# Patient Record
Sex: Female | Born: 1940 | Race: White | Hispanic: No | Marital: Married
Health system: Southern US, Community
[De-identification: ages and names within clinical notes are randomized; demographics above are authoritative.]

---

## 2014-12-12 ENCOUNTER — Emergency Department: Payer: Medicare Other

## 2014-12-12 ENCOUNTER — Emergency Department
Admission: EM | Admit: 2014-12-12 | Discharge: 2014-12-12 | Disposition: A | Discharge status: Home / Self Care | Payer: Medicare Other | Attending: Emergency Medicine | Admitting: Emergency Medicine

## 2014-12-12 DIAGNOSIS — J209 Acute bronchitis, unspecified: Secondary | ICD-10-CM | POA: Diagnosis not present

## 2014-12-12 DIAGNOSIS — J40 Bronchitis, not specified as acute or chronic: Secondary | ICD-10-CM

## 2014-12-12 DIAGNOSIS — J029 Acute pharyngitis, unspecified: Secondary | ICD-10-CM | POA: Diagnosis present

## 2014-12-12 LAB — POCT RAPID STREP A: Streptococcus, Group A Screen (Direct): NEGATIVE

## 2014-12-12 MED ORDER — LEVOFLOXACIN 500 MG PO TABS
500.0000 mg | ORAL_TABLET | Freq: Once | ORAL | Status: AC
  Administered 2014-12-12: 500 mg via ORAL
  Filled 2014-12-12: qty 1

## 2014-12-12 MED ORDER — LEVOFLOXACIN 500 MG PO TABS: 500.0000 mg | ORAL_TABLET | Freq: Every day | ORAL | Status: AC

## 2014-12-12 NOTE — ED Notes (Signed)
Pt to ED c/o persistent sore throat and cough for approximately 1 week. Currently on PO course of Cefidinir.

## 2014-12-12 NOTE — ED Notes (Signed)

## 2014-12-12 NOTE — Discharge Instructions (Signed)
Pharyngitis °Pharyngitis is redness, pain, and swelling (inflammation) of your pharynx.  °CAUSES  °Pharyngitis is usually caused by infection. Most of the time, these infections are from viruses (viral) and are part of a cold. However, sometimes pharyngitis is caused by bacteria (bacterial). Pharyngitis can also be caused by allergies. Viral pharyngitis may be spread from person to person by coughing, sneezing, and personal items or utensils (cups, forks, spoons, toothbrushes). Bacterial pharyngitis may be spread from person to person by more intimate contact, such as kissing.  °SIGNS AND SYMPTOMS  °Symptoms of pharyngitis include:   °· Sore throat.   °· Tiredness (fatigue).   °· Low-grade fever.   °· Headache. °· Joint pain and muscle aches. °· Skin rashes. °· Swollen lymph nodes. °· Plaque-like film on throat or tonsils (often seen with bacterial pharyngitis). °DIAGNOSIS  °Your health care provider will ask you questions about your illness and your symptoms. Your medical history, along with a physical exam, is often all that is needed to diagnose pharyngitis. Sometimes, a rapid strep test is done. Other lab tests may also be done, depending on the suspected cause.  °TREATMENT  °Viral pharyngitis will usually get better in 3-4 days without the use of medicine. Bacterial pharyngitis is treated with medicines that kill germs (antibiotics).  °HOME CARE INSTRUCTIONS  °· Drink enough water and fluids to keep your urine clear or pale yellow.   °· Only take over-the-counter or prescription medicines as directed by your health care provider:   °¨ If you are prescribed antibiotics, make sure you finish them even if you start to feel better.   °¨ Do not take aspirin.   °· Get lots of rest.   °· Gargle with 8 oz of salt water (½ tsp of salt per 1 qt of water) as often as every 1-2 hours to soothe your throat.   °· Throat lozenges (if you are not at risk for choking) or sprays may be used to soothe your throat. °SEEK MEDICAL  CARE IF:  °· You have large, tender lumps in your neck. °· You have a rash. °· You cough up green, yellow-[place]inskey, or bloody spit. °SEEK IMMEDIATE MEDICAL CARE IF:  °· Your neck becomes stiff. °· You drool or are unable to swallow liquids. °· You vomit or are unable to keep medicines or liquids down. °· You have severe pain that does not go away with the use of recommended medicines. °· You have trouble breathing (not caused by a stuffy nose). °MAKE SURE YOU:  °· Understand these instructions. °· Will watch your condition. °· Will get help right away if you are not doing well or get worse. °Document Released: 04/25/2005 Document Revised: 02/13/2013 Document Reviewed: 12/31/2012 °[employer]® Patient Information ©2015 [employer]. This information is not intended to replace advice given to you by your health care provider. Make sure you discuss any questions you have with your health care provider. °Acute Bronchitis °Bronchitis is inflammation of the airways that extend from the windpipe into the lungs (bronchi). The inflammation often causes mucus to develop. This leads to a cough, which is the most common symptom of bronchitis.  °In acute bronchitis, the condition usually develops suddenly and goes away over time, usually in a couple weeks. Smoking, allergies, and asthma can make bronchitis worse. Repeated episodes of bronchitis may cause further lung problems.  °CAUSES °Acute bronchitis is most often caused by the same virus that causes a cold. The virus can spread from person to person (contagious) through coughing, sneezing, and touching contaminated objects. °SIGNS AND SYMPTOMS  °·   Cough.   °· Fever.   °· Coughing up mucus.   °· Body aches.   °· Chest congestion.   °· Chills.   °· Shortness of breath.   °· Sore throat.   °DIAGNOSIS  °Acute bronchitis is usually diagnosed through a physical exam. Your health care provider will also ask you questions about your medical history. Tests, such as chest X-rays, are  sometimes done to rule out other conditions.  °TREATMENT  °Acute bronchitis usually goes away in a couple weeks. Oftentimes, no medical treatment is necessary. Medicines are sometimes given for relief of fever or cough. Antibiotic medicines are usually not needed but may be prescribed in certain situations. In some cases, an inhaler may be recommended to help reduce shortness of breath and control the cough. A cool mist vaporizer may also be used to help thin bronchial secretions and make it easier to clear the chest.  °HOME CARE INSTRUCTIONS °· Get plenty of rest.   °· Drink enough fluids to keep your urine clear or pale yellow (unless you have a medical condition that requires fluid restriction). Increasing fluids may help thin your respiratory secretions (sputum) and reduce chest congestion, and it will prevent dehydration.   °· Take medicines only as directed by your health care provider. °· If you were prescribed an antibiotic medicine, finish it all even if you start to feel better. °· Avoid smoking and secondhand smoke. Exposure to cigarette smoke or irritating chemicals will make bronchitis worse. If you are a smoker, consider using nicotine gum or skin patches to help control withdrawal symptoms. Quitting smoking will help your lungs heal faster.   °· Reduce the chances of another bout of acute bronchitis by washing your hands frequently, avoiding people with cold symptoms, and trying not to touch your hands to your mouth, nose, or eyes.   °· Keep all follow-up visits as directed by your health care provider.   °SEEK MEDICAL CARE IF: °Your symptoms do not improve after 1 week of treatment.  °SEEK IMMEDIATE MEDICAL CARE IF: °· You develop an increased fever or chills.   °· You have chest pain.   °· You have severe shortness of breath. °· You have bloody sputum.   °· You develop dehydration. °· You faint or repeatedly feel like you are going to pass out. °· You develop repeated vomiting. °· You develop a  severe headache. °MAKE SURE YOU:  °· Understand these instructions. °· Will watch your condition. °· Will get help right away if you are not doing well or get worse. °Document Released: 06/02/2004 Document Revised: 09/09/2013 Document Reviewed: 10/16/2012 °[employer]® Patient Information ©2015 [employer]. This information is not intended to replace advice given to you by your health care provider. Make sure you discuss any questions you have with your health care provider. ° °

## 2014-12-14 LAB — CULTURE, GROUP A STREP (THRC)

## 2014-12-18 NOTE — ED Provider Notes (Signed)
[facility] Emergency Department Provider Note  ____________________________________________  Time seen: 3:00 AM I have reviewed the triage vital signs and the nursing notes.   HISTORY  Chief Complaint Sore Throat     HPI Heather Cooley is a 74 y.o. female presents with persistent sore throat and cough approximately one week. Patient denies any fever of note patient was seen in prescribed Cefdinir by unknown M.D. She stated that she's completed this course however symptoms have persisted. She denies any fever     No past medical history on file.  There are no active problems to display for this patient.   No past surgical history on file.  Current Outpatient Rx  Name  Route  Sig  Dispense  Refill  . levofloxacin (LEVAQUIN) 500 MG tablet   Oral   Take 1 tablet (500 mg total) by mouth daily.   7 tablet   0     Allergies Review of patient's allergies indicates not on file.  No family history on file.  Social History Social History  Substance Use Topics  . Smoking status: Not on file  . Smokeless tobacco: Not on file  . Alcohol Use: Not on file    Review of Systems  Constitutional: Negative for fever. Eyes: Negative for visual changes. ENT: Positive for sore throat. Cardiovascular: Negative for chest pain. Respiratory: Negative for shortness of breath. Positive for cough Gastrointestinal: Negative for abdominal pain, vomiting and diarrhea. Genitourinary: Negative for dysuria. Musculoskeletal: Negative for back pain. Skin: Negative for rash. Neurological: Negative for headaches, focal weakness or numbness.   10-point ROS otherwise negative.  ____________________________________________   PHYSICAL EXAM:  VITAL SIGNS: ED Triage Vitals  Enc Vitals Group     BP 12/12/14 0302 143/79 mmHg     Pulse Rate 12/12/14 0302 56     Resp 12/12/14 0302 18     Temp 12/12/14 0302 98.2 F (36.8 C)     Temp Source 12/12/14 0302 Oral      SpO2 12/12/14 0302 99 %     Weight 12/12/14 0302 184 lb (83.462 kg)     Height 12/12/14 0302 5\' 2"  (1.575 m)     Head Cir --      Peak Flow --      Pain Score 12/12/14 0304 6     Pain Loc --      Pain Edu? --      Excl. in GC? --      Constitutional: Alert and oriented. Well appearing and in no distress. Eyes: Conjunctivae are normal. PERRL. Normal extraocular movements. ENT   Head: Normocephalic and atraumatic.   Nose: No congestion/rhinnorhea.   Mouth/Throat: Mucous membranes are moist.   Neck: No stridor. Cardiovascular: Normal rate, regular rhythm. Normal and symmetric distal pulses are present in all extremities. No murmurs, rubs, or gallops. Respiratory: Normal respiratory effort without tachypnea nor retractions. Breath sounds are clear and equal bilaterally. No wheezes/rales/rhonchi. Gastrointestinal: Soft and nontender. No distention. There is no CVA tenderness. Genitourinary: deferred Musculoskeletal: Nontender with normal range of motion in all extremities. No joint effusions.  No lower extremity tenderness nor edema. Neurologic:  Normal speech and language. No gross focal neurologic deficits are appreciated. Speech is normal.  Skin:  Skin is warm, dry and intact. No rash noted. Psychiatric: Mood and affect are normal. Speech and behavior are normal. Patient exhibits appropriate insight and judgment.      DG Chest 2 View (Final result) Result time: 12/12/14 03:58:46   Final  result by Rad Results In Interface (12/12/14 03:58:46)   Narrative:   CLINICAL DATA: Persistent sore throat and cough for approximately 1 week.  EXAM: CHEST 2 VIEW  COMPARISON: None.  FINDINGS: The heart size and mediastinal contours are within normal limits. Both lungs are clear. The visualized skeletal structures are unremarkable.  IMPRESSION: No active cardiopulmonary disease.   Electronically Signed By: Ellery Plunk M.D. On: 12/12/2014 03:58             INITIAL IMPRESSION / ASSESSMENT AND PLAN / ED COURSE  Pertinent labs & imaging results that were available during my care of the patient were reviewed by me and considered in my medical decision making (see chart for details).    ____________________________________________   FINAL CLINICAL IMPRESSION(S) / ED DIAGNOSES  Final diagnoses:  Bronchitis  Pharyngitis  Acute bronchitis, unspecified organism      Darci Current, MD 12/18/14 [id number]

## 2016-09-18 IMAGING — CR DG CHEST 2V
2 series · 2 of 2 positions shown · non-contrast
Comparison: None.

CLINICAL DATA: Persistent sore throat and cough for approximately 1
week.

EXAM:
CHEST  2 VIEW

[chest pa]
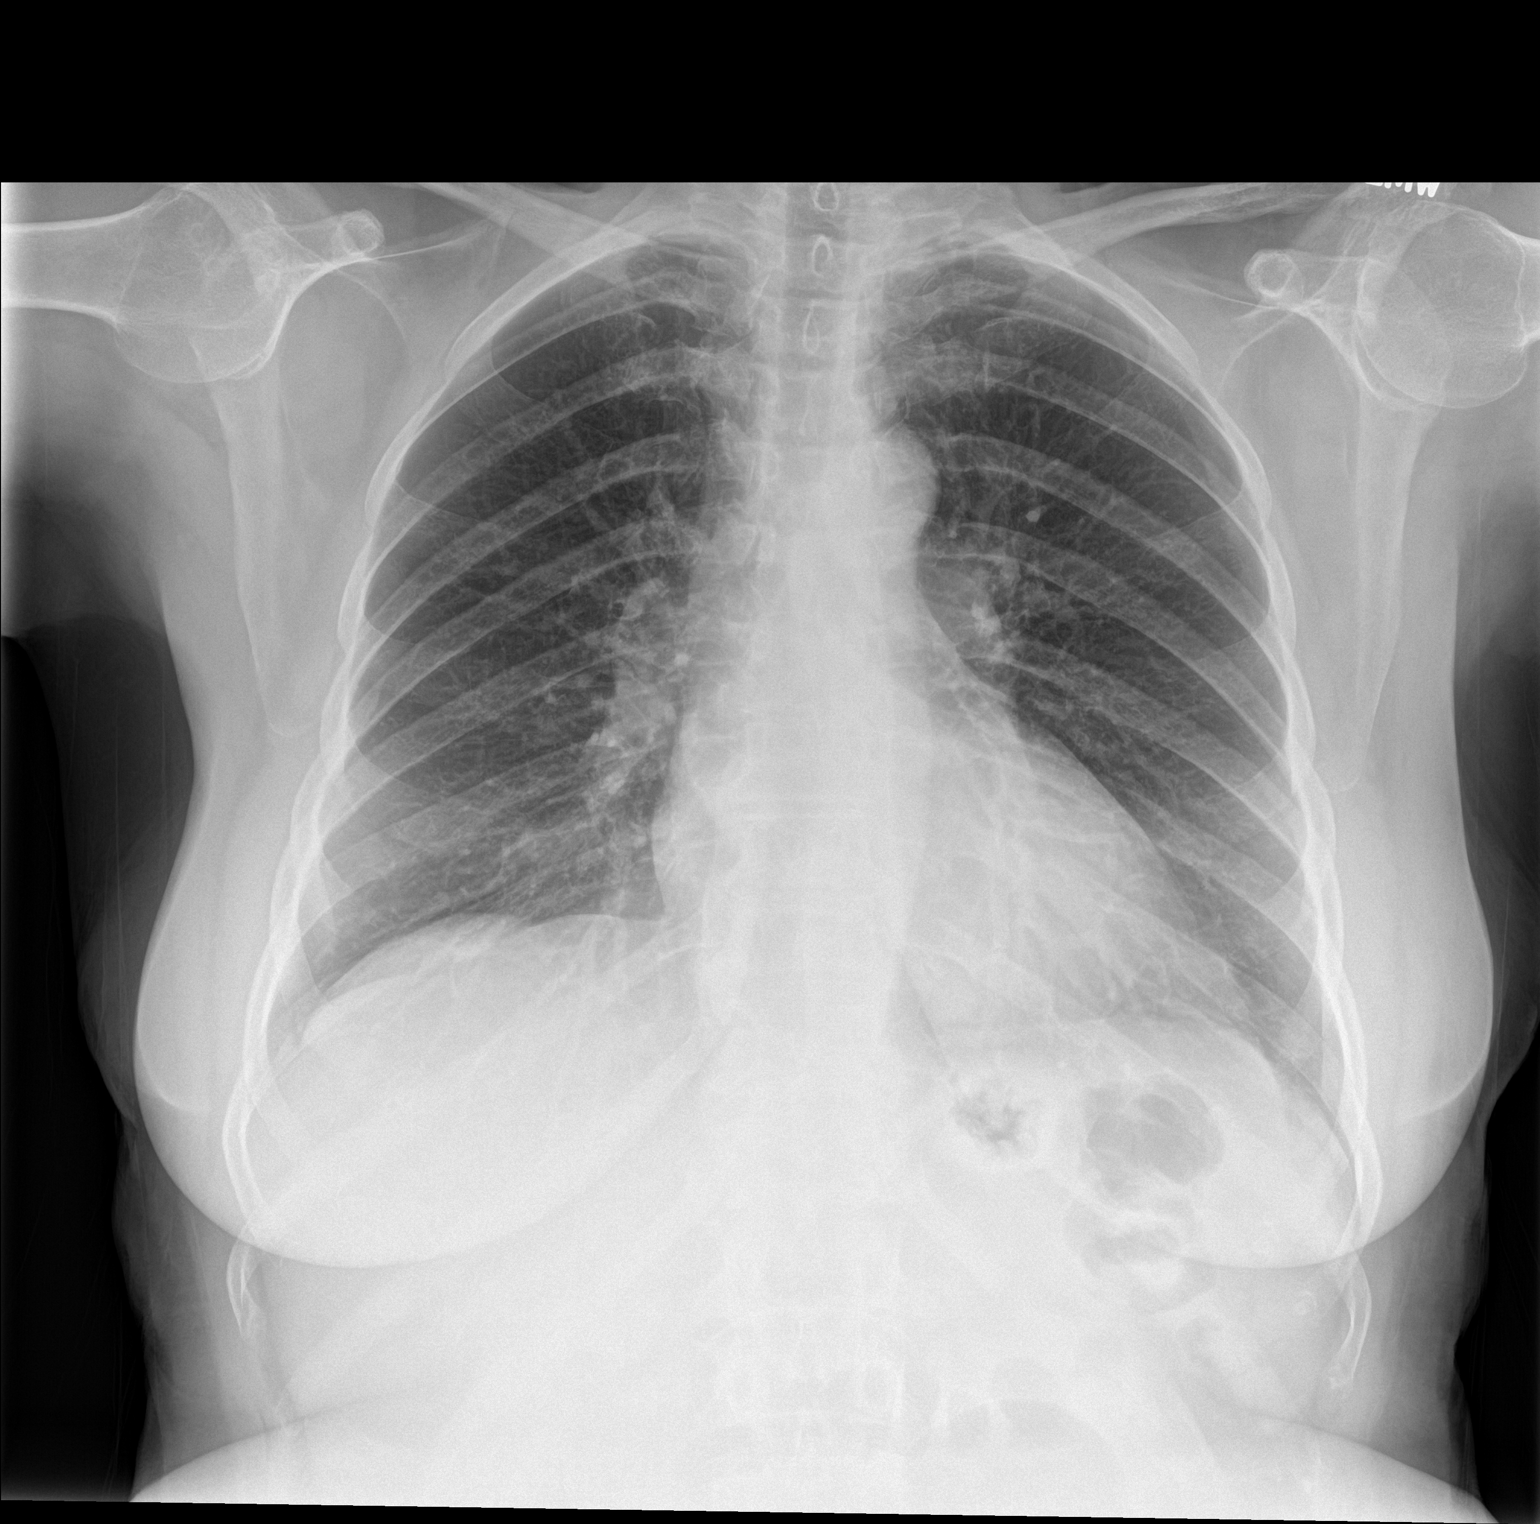

[chest lat]
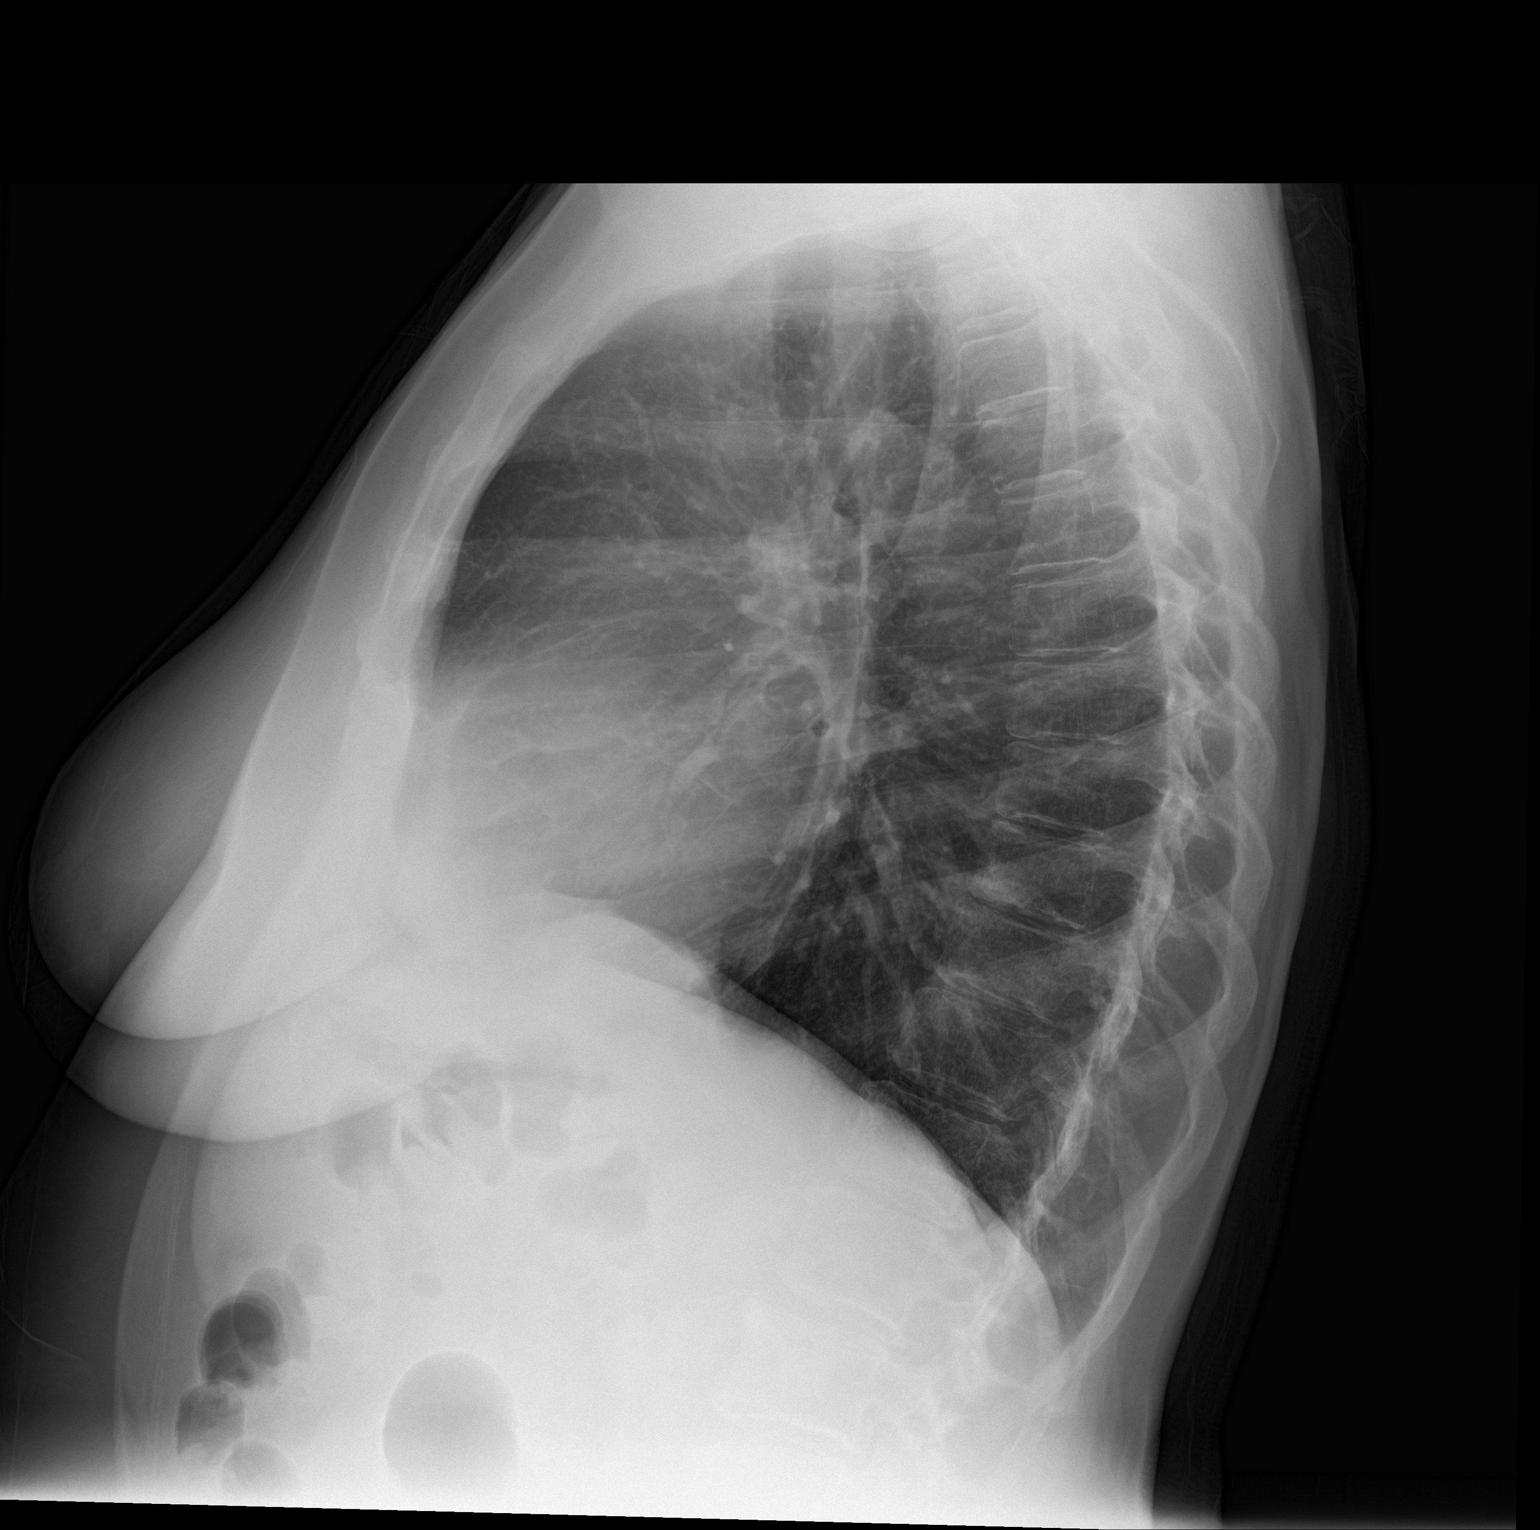

[2 of 2 positions shown; findings below may reference images not displayed]

FINDINGS: The heart size and mediastinal contours are within normal limits.
Both lungs are clear. The visualized skeletal structures are
unremarkable.
IMPRESSION: No active cardiopulmonary disease.
